# Patient Record
Sex: Female | Born: 1965 | Hispanic: Yes | Marital: Married | Smoking: Current every day smoker
Health system: Southern US, Community
[De-identification: ages and names within clinical notes are randomized; demographics above are authoritative.]

## PROBLEM LIST (undated history)

## (undated) DIAGNOSIS — G510 Bell's palsy: Secondary | ICD-10-CM

## (undated) HISTORY — PX: TUBAL LIGATION: SHX77

---

## 2016-05-09 ENCOUNTER — Emergency Department (HOSPITAL_BASED_OUTPATIENT_CLINIC_OR_DEPARTMENT_OTHER): Payer: BLUE CROSS/BLUE SHIELD

## 2016-05-09 ENCOUNTER — Emergency Department (HOSPITAL_BASED_OUTPATIENT_CLINIC_OR_DEPARTMENT_OTHER)
Admission: EM | Admit: 2016-05-09 | Discharge: 2016-05-09 | Disposition: A | Discharge status: Other Acute Inpatient Hospital | Payer: BLUE CROSS/BLUE SHIELD | Attending: Emergency Medicine | Admitting: Emergency Medicine

## 2016-05-09 ENCOUNTER — Encounter (HOSPITAL_BASED_OUTPATIENT_CLINIC_OR_DEPARTMENT_OTHER): Payer: Self-pay | Admitting: Emergency Medicine

## 2016-05-09 DIAGNOSIS — R0602 Shortness of breath: Secondary | ICD-10-CM | POA: Insufficient documentation

## 2016-05-09 DIAGNOSIS — R112 Nausea with vomiting, unspecified: Secondary | ICD-10-CM | POA: Diagnosis not present

## 2016-05-09 DIAGNOSIS — R05 Cough: Secondary | ICD-10-CM | POA: Insufficient documentation

## 2016-05-09 DIAGNOSIS — R5383 Other fatigue: Secondary | ICD-10-CM | POA: Diagnosis not present

## 2016-05-09 DIAGNOSIS — R739 Hyperglycemia, unspecified: Secondary | ICD-10-CM

## 2016-05-09 DIAGNOSIS — E1010 Type 1 diabetes mellitus with ketoacidosis without coma: Secondary | ICD-10-CM | POA: Insufficient documentation

## 2016-05-09 DIAGNOSIS — F17200 Nicotine dependence, unspecified, uncomplicated: Secondary | ICD-10-CM | POA: Diagnosis not present

## 2016-05-09 DIAGNOSIS — E1110 Type 2 diabetes mellitus with ketoacidosis without coma: Secondary | ICD-10-CM

## 2016-05-09 HISTORY — DX: Bell's palsy: G51.0

## 2016-05-09 LAB — BASIC METABOLIC PANEL
ANION GAP: 18 — AB (ref 5–15)
Anion gap: 13 (ref 5–15)
Anion gap: 10 (ref 5–15)
BUN: 8 mg/dL (ref 6–20)
BUN: 6 mg/dL (ref 6–20)
BUN: 6 mg/dL (ref 6–20)
CO2: 9 mmol/L — AB (ref 22–32)
CO2: 10 mmol/L — ABNORMAL LOW (ref 22–32)
CO2: 12 mmol/L — ABNORMAL LOW (ref 22–32)
Calcium: 9 mg/dL (ref 8.9–10.3)
Calcium: 7.6 mg/dL — ABNORMAL LOW (ref 8.9–10.3)
Calcium: 7.5 mg/dL — ABNORMAL LOW (ref 8.9–10.3)
Chloride: 100 mmol/L — ABNORMAL LOW (ref 101–111)
Chloride: 109 mmol/L (ref 101–111)
Chloride: 110 mmol/L (ref 101–111)
Creatinine, Ser: 0.74 mg/dL (ref 0.44–1.00)
Creatinine, Ser: 0.57 mg/dL (ref 0.44–1.00)
Creatinine, Ser: 0.61 mg/dL (ref 0.44–1.00)
GFR calc Af Amer: >60 mL/min (ref >60)
GFR calc Af Amer: >60 mL/min (ref >60)
GFR calc Af Amer: >60 mL/min (ref >60)
GFR calc non Af Amer: >60 mL/min (ref >60)
GFR calc non Af Amer: >60 mL/min (ref >60)
GLUCOSE: 384 mg/dL — AB (ref 65–99)
Glucose, Bld: 204 mg/dL — ABNORMAL HIGH (ref 65–99)
Glucose, Bld: 183 mg/dL — ABNORMAL HIGH (ref 65–99)
POTASSIUM: 3.9 mmol/L (ref 3.5–5.1)
Potassium: 3.5 mmol/L (ref 3.5–5.1)
Potassium: 4.4 mmol/L (ref 3.5–5.1)
Sodium: 127 mmol/L — ABNORMAL LOW (ref 135–145)
Sodium: 132 mmol/L — ABNORMAL LOW (ref 135–145)
Sodium: 132 mmol/L — ABNORMAL LOW (ref 135–145)

## 2016-05-09 LAB — CBG MONITORING, ED
Glucose-Capillary: 433 mg/dL — ABNORMAL HIGH (ref 65–99)
Glucose-Capillary: 169 mg/dL — ABNORMAL HIGH (ref 65–99)
Glucose-Capillary: 177 mg/dL — ABNORMAL HIGH (ref 65–99)

## 2016-05-09 LAB — I-STAT VENOUS BLOOD GAS, ED
Acid-base deficit: 17 mmol/L — ABNORMAL HIGH (ref 0.0–2.0)
Bicarbonate: 9.2 mEq/L — ABNORMAL LOW (ref 20.0–24.0)
O2 SAT: 82 %
PCO2 VEN: 22.8 mmHg — AB (ref 45.0–50.0)
PH VEN: 7.209 — AB (ref 7.250–7.300)
PO2 VEN: 53 mmHg — AB (ref 31.0–45.0)
Patient temperature: 97.8
TCO2: 10 mmol/L (ref 0–100)

## 2016-05-09 LAB — URINE MICROSCOPIC-ADD ON
RBC / HPF: NONE SEEN RBC/hpf (ref 0–5)
WBC UA: NONE SEEN WBC/hpf (ref 0–5)

## 2016-05-09 LAB — URINALYSIS, ROUTINE W REFLEX MICROSCOPIC
BILIRUBIN URINE: NEGATIVE
Glucose, UA: >1000 mg/dL — AB
Ketones, ur: >80 mg/dL — AB
Leukocytes, UA: NEGATIVE
NITRITE: NEGATIVE
PH: 5 (ref 5.0–8.0)
Protein, ur: 100 mg/dL — AB
SPECIFIC GRAVITY, URINE: 1.036 — AB (ref 1.005–1.030)

## 2016-05-09 LAB — CBC
HEMATOCRIT: 46.2 % — AB (ref 36.0–46.0)
HEMOGLOBIN: 16.6 g/dL — AB (ref 12.0–15.0)
MCH: 30.4 pg (ref 26.0–34.0)
MCHC: 35.9 g/dL (ref 30.0–36.0)
MCV: 84.6 fL (ref 78.0–100.0)
Platelets: 283 10*3/uL (ref 150–400)
RBC: 5.46 MIL/uL — ABNORMAL HIGH (ref 3.87–5.11)
RDW: 13.2 % (ref 11.5–15.5)
WBC: 12.2 10*3/uL — ABNORMAL HIGH (ref 4.0–10.5)

## 2016-05-09 MED ORDER — DEXTROSE 50 % IV SOLN
1.0000 | Freq: Once | INTRAVENOUS | Status: DC | PRN
  Filled 2016-05-09: qty 50

## 2016-05-09 MED ORDER — SODIUM CHLORIDE 0.9 % IV BOLUS (SEPSIS)
1000.0000 mL | Freq: Once | INTRAVENOUS | Status: AC
  Administered 2016-05-09: 1000 mL via INTRAVENOUS

## 2016-05-09 MED ORDER — SODIUM CHLORIDE 0.9 % IV SOLN
INTRAVENOUS | Status: DC
  Administered 2016-05-09: 3.2 [IU]/h via INTRAVENOUS
  Filled 2016-05-09 (×2): qty 2.5

## 2016-05-09 MED ORDER — INSULIN ASPART 100 UNIT/ML ~~LOC~~ SOLN
10.0000 [IU] | Freq: Once | SUBCUTANEOUS | Status: AC
  Administered 2016-05-09: 10 [IU] via INTRAVENOUS
  Filled 2016-05-09: qty 1

## 2016-05-09 MED ORDER — DEXTROSE-NACL 5-0.45 % IV SOLN
INTRAVENOUS | Status: DC
  Administered 2016-05-09: 12:00:00 via INTRAVENOUS

## 2016-05-09 NOTE — ED Notes (Addendum)
Pt states she checked her glucose yesterday at home and found it to be over 500. Pt c/o headache, increased thirst, and general malaise x 1 week. Pt denies hx of diabetes. Pt also concerned about ongoing productive cough x 4 months with brown sputum.

## 2016-05-09 NOTE — ED Notes (Signed)
Report to [facility] staff, pt care transferred. Pt is a/a/o x 4, denies any c/o. Pt and family aware of pending transport and inpatient admit to room 471.

## 2016-05-09 NOTE — ED Notes (Signed)
MD at bedside. 

## 2016-05-09 NOTE — ED Provider Notes (Signed)
CSN: [id number]     Arrival date & time 05/09/16  7829 History   First MD Initiated Contact with Patient 05/09/16 1012     Chief Complaint  Patient presents with  . Hyperglycemia     (Consider location/radiation/quality/duration/timing/severity/associated sxs/prior Treatment) HPI Comments: Patient is a previously healthy 50 year old female who presents with 2 weeks of fatigue, polydipsia. Patient has also had associated nausea, decreased appetite, and blurry vision. Patient wears reading glasses, but she has had blurry vision even with her reading glasses. Patient had one episode of vomiting today. Patient has also had 4 months of a productive cough with light yellow sputum. Patient denies any hemoptysis, nasal congestion. Patient has had episodes of shortness of breath, usually when she is smoking cigarettes, but once today when not smoking cigarettes. Patient smokes 2 packs per week. Patient has no tried any medications at home. Patient denies any chest pain, abdominal pain, dysuria. Patient has not seen in doctor since 2013.  Patient is a 50 y.o. female presenting with hyperglycemia. The history is provided by the patient.  Hyperglycemia Associated symptoms: fatigue, increased thirst, nausea, shortness of breath and vomiting   Associated symptoms: no abdominal pain, no chest pain, no dysuria and no fever     Past Medical History  Diagnosis Date  . Bell's palsy    Past Surgical History  Procedure Laterality Date  . Tubal ligation     No family history on file. Social History  Substance Use Topics  . Smoking status: Current Every Day Smoker  . Smokeless tobacco: None  . Alcohol Use: Yes     Comment: weekly   OB History    No data available     Review of Systems  Constitutional: Positive for appetite change and fatigue. Negative for fever and chills.  HENT: Negative for congestion, facial swelling and sore throat.   Eyes: Positive for visual disturbance (blurry vision).   Respiratory: Positive for cough and shortness of breath.   Cardiovascular: Negative for chest pain.  Gastrointestinal: Positive for nausea and vomiting. Negative for abdominal pain.  Endocrine: Positive for polydipsia.  Genitourinary: Negative for dysuria.  Musculoskeletal: Negative for back pain.  Skin: Negative for rash and wound.  Neurological: Negative for headaches.  Psychiatric/Behavioral: The patient is not nervous/anxious.       Allergies  Review of patient's allergies indicates no known allergies.  Home Medications   Prior to Admission medications   Not on File   BP 116/83 mmHg  Pulse 91  Temp(Src) 97.8 F (36.6 C) (Oral)  Resp 21  Ht  (1.549 m)  Wt 52.164 kg  BMI 21.74 kg/m2  SpO2 100% Physical Exam  Constitutional: She appears well-developed and well-nourished. No distress.  HENT:  Head: Normocephalic and atraumatic.  Mouth/Throat: Oropharynx is clear and moist. No oropharyngeal exudate.  Eyes: Conjunctivae are normal. Pupils are equal, round, and reactive to light. Right eye exhibits no discharge. Left eye exhibits no discharge. No scleral icterus.  Neck: Normal range of motion. Neck supple. No thyromegaly present.  Cardiovascular: Normal rate, regular rhythm, normal heart sounds and intact distal pulses.  Exam reveals no gallop and no friction rub.   No murmur heard. Pulmonary/Chest: Effort normal and breath sounds normal. No stridor. No respiratory distress. She has no wheezes. She has no rales.  Abdominal: Soft. Bowel sounds are normal. She exhibits no distension. There is no tenderness. There is no rebound and no guarding.  Musculoskeletal: She exhibits no edema.  5/5 strength in all  4 extremities  Lymphadenopathy:    She has no cervical adenopathy.  Neurological: She is alert. Coordination normal.  Normal sensation throughout; equal bilateral grip strength  Skin: Skin is warm and dry. No rash noted. She is not diaphoretic. No pallor.   Psychiatric: She has a normal mood and affect.  Nursing note and vitals reviewed.   ED Course  Procedures (including critical care time)  CRITICAL CARE Performed by: Emi Holes   Total critical care time: 30 minutes  Critical care time was exclusive of separately billable procedures and treating other patients.  Critical care was necessary to treat or prevent imminent or life-threatening deterioration.  Critical care was time spent personally by me on the following activities: development of treatment plan with patient and/or surrogate as well as nursing, discussions with consultants, evaluation of patient's response to treatment, examination of patient, obtaining history from patient or surrogate, ordering and performing treatments and interventions, ordering and review of laboratory studies, ordering and review of radiographic studies, pulse oximetry and re-evaluation of patient's condition.   Labs Review Labs Reviewed  BASIC METABOLIC PANEL - Abnormal; Notable for the following:    Sodium 127 (*)    Chloride 100 (*)    CO2 9 (*)    Glucose, Bld 384 (*)    Anion gap 18 (*)    All other components within normal limits  CBC - Abnormal; Notable for the following:    WBC 12.2 (*)    RBC 5.46 (*)    Hemoglobin 16.6 (*)    HCT 46.2 (*)    All other components within normal limits  URINALYSIS, ROUTINE W REFLEX MICROSCOPIC (NOT AT [facility]) - Abnormal; Notable for the following:    Specific Gravity, Urine 1.036 (*)    Glucose, UA >1000 (*)    Hgb urine dipstick SMALL (*)    Ketones, ur >80 (*)    Protein, ur 100 (*)    All other components within normal limits  URINE MICROSCOPIC-ADD ON - Abnormal; Notable for the following:    Squamous Epithelial / LPF 0-5 (*)    Bacteria, UA RARE (*)    All other components within normal limits  BASIC METABOLIC PANEL - Abnormal; Notable for the following:    Sodium 132 (*)    CO2 10 (*)    Glucose, Bld 204 (*)    Calcium 7.6 (*)     All other components within normal limits  CBG MONITORING, ED - Abnormal; Notable for the following:    Glucose-Capillary 433 (*)    All other components within normal limits  CBG MONITORING, ED - Abnormal; Notable for the following:    Glucose-Capillary 169 (*)    All other components within normal limits  I-STAT VENOUS BLOOD GAS, ED - Abnormal; Notable for the following:    pH, Ven 7.209 (*)    pCO2, Ven 22.8 (*)    pO2, Ven 53.0 (*)    Bicarbonate 9.2 (*)    Acid-base deficit 17.0 (*)    All other components within normal limits  BLOOD GAS, VENOUS  BASIC METABOLIC PANEL  BASIC METABOLIC PANEL  BASIC METABOLIC PANEL  BASIC METABOLIC PANEL  BASIC METABOLIC PANEL  BASIC METABOLIC PANEL  BASIC METABOLIC PANEL  CBG MONITORING, ED  CBG MONITORING, ED  CBG MONITORING, ED    Imaging Review Dg Chest 2 View  05/09/2016  CLINICAL DATA:  Ongoing productive cough for the past 4 months. EXAM: CHEST  2 VIEW COMPARISON:  None. FINDINGS:  Partial obscuration of the right heart border secondary to ill-defined anterior mediastinal structure which is favored to represent a prominent epicardial fat pad. Otherwise, normal cardiac silhouette and mediastinal contours. No focal airspace opacities. No pleural effusion or pneumothorax. There is minimal pleural parenchymal thickening about the bilateral major fissures. No evidence of edema. No acute osseous abnormalities. IMPRESSION: No acute cardiopulmonary disease. Specifically, no evidence of pneumonia. Electronically Signed   By: Simonne ComeJohn  Watts M.D.   On: 05/09/2016 10:43   I have personally reviewed and evaluated these images and lab results as part of my medical decision-making.   EKG Interpretation None      MDM   New onset diabetic in DKA. Initial CBG 433. CBC shows WBC 12.2, hemoglobin 16.6. BMP shows sodium 127, potassium 3.9, chloride 100, CO2 9, anion gap 18. VBG shows pH 7.209, PCO2 22.8, PO2 53, bicarbonate 9.2, acid-base deficit 17. CXR  shows no acute cardiopulmonary disease and no evidence of pneumonia. Patient given insulin bolus and drip initiated in ED, as well as 2 L of fluids. Patient would like to be admitted to [facility], as she [place] in [place] than [place]. I spoke with Dr. Ellyn HackPeter Brath at [facility] who will admit the patient to the ICU. Patient also evaluated by Dr. Clydene PughKnott who is in agreement with plan. Patient understands and is in agreement with plan.  Final diagnoses:  Hyperglycemia  Diabetic ketoacidosis without coma associated with type 2 diabetes mellitus [facility](HCC)        Emi Holeslexandra M Quandra Fedorchak, PA-C 05/09/16 1311  Lyndal Pulleyaniel Knott, MD 05/09/16 [phone]

## 2017-02-14 IMAGING — CR DG CHEST 2V
2 series · 2 of 2 positions shown · non-contrast
Comparison: None.

CLINICAL DATA: Ongoing productive cough for the past 4 months.

EXAM:
CHEST  2 VIEW

[w chest pa]
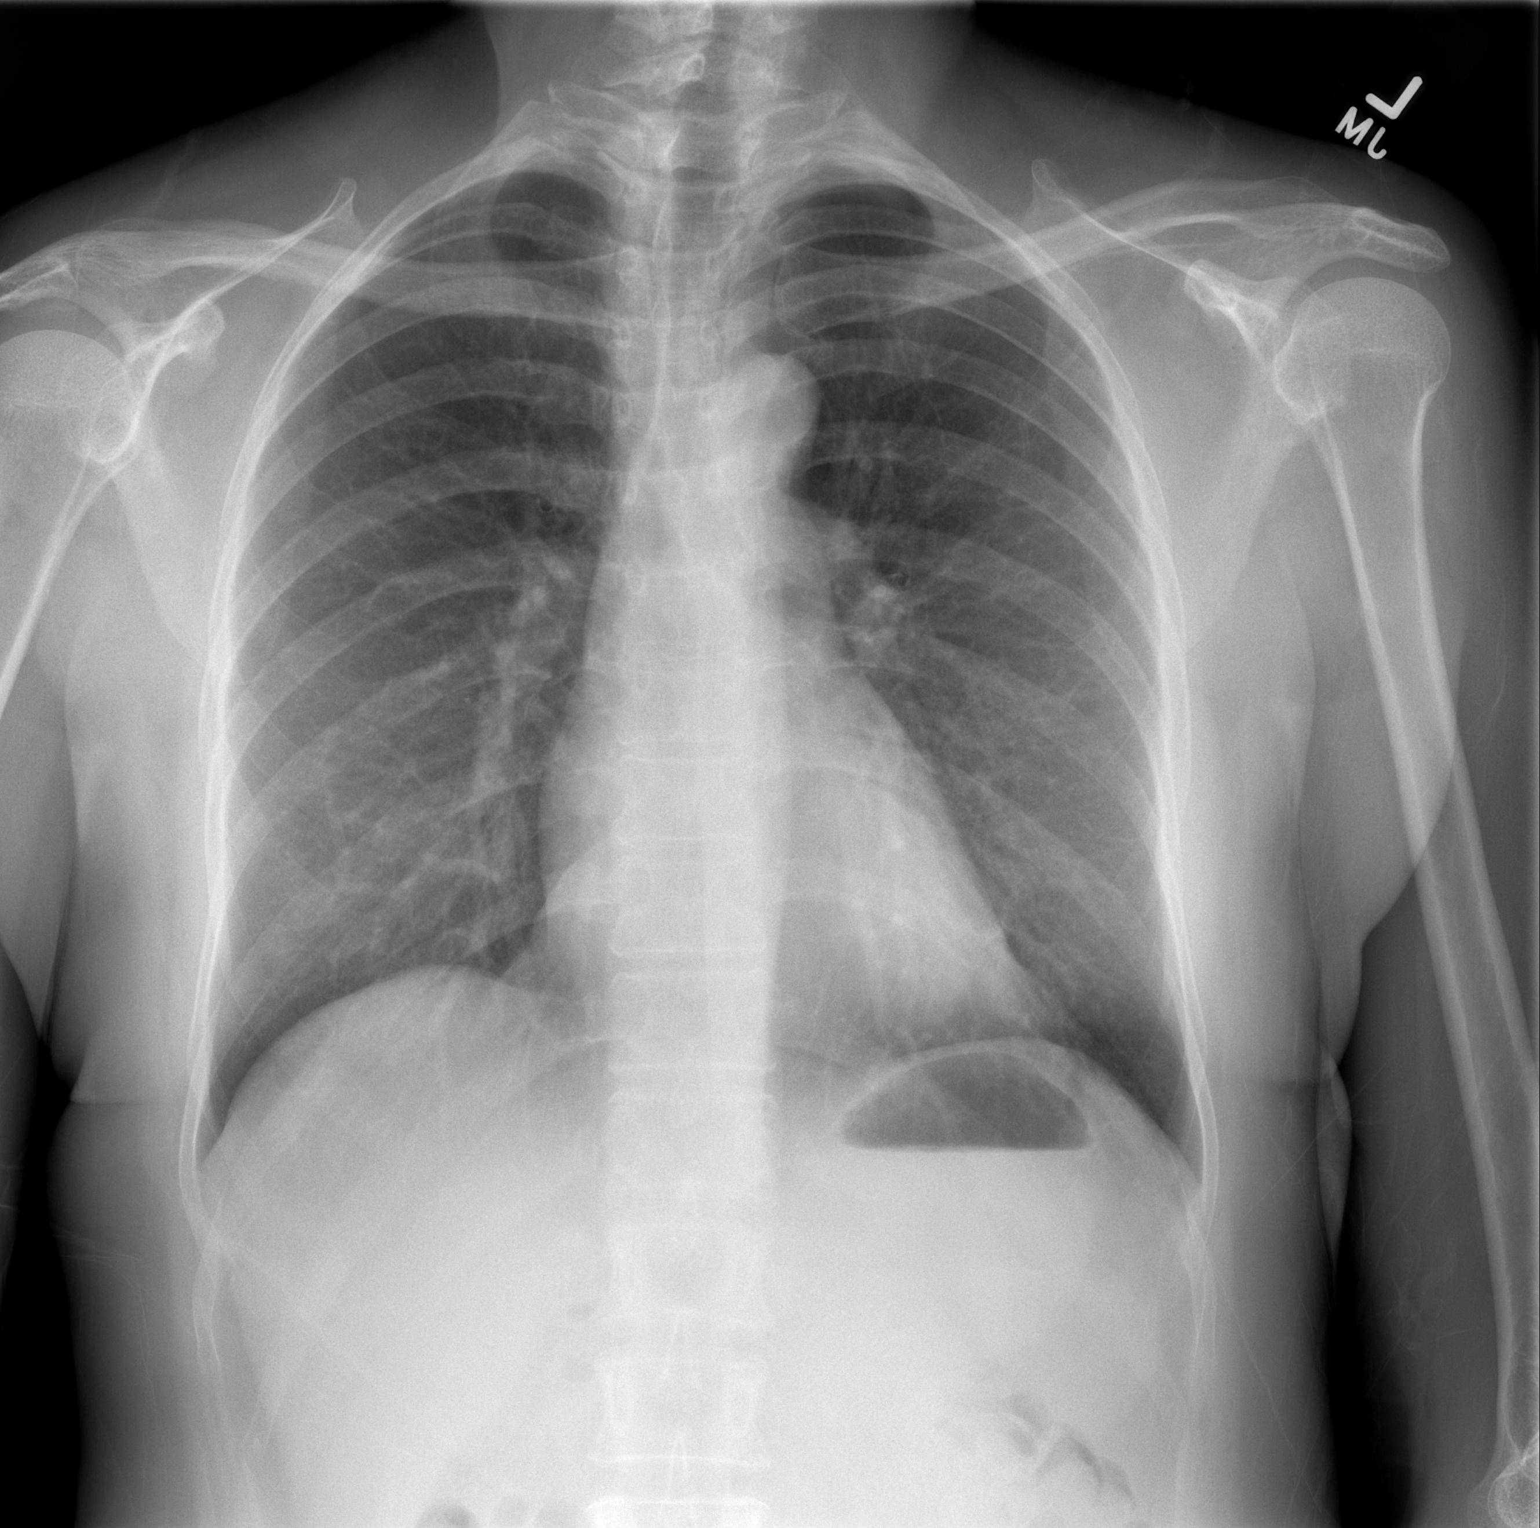

[w chest lat]
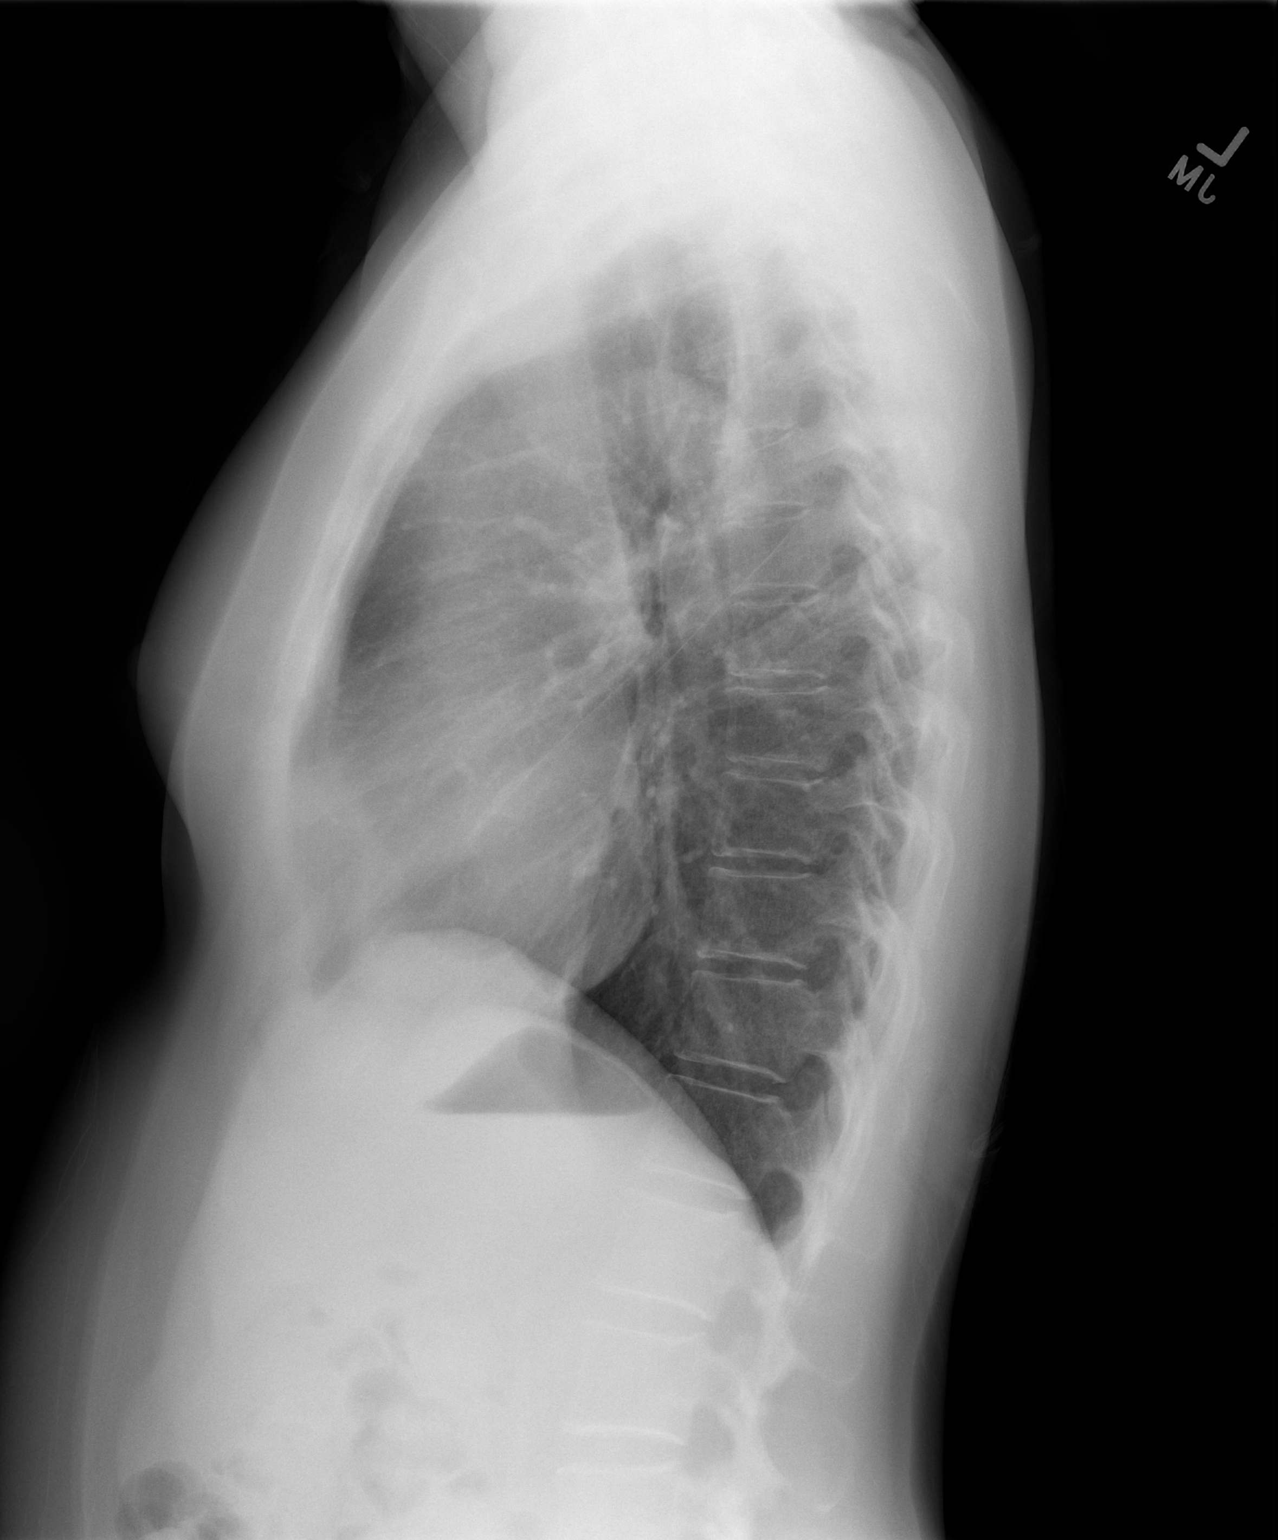

[2 of 2 positions shown; findings below may reference images not displayed]

FINDINGS: Partial obscuration of the right heart border secondary to
ill-defined anterior mediastinal structure which is favored to
represent a prominent epicardial fat pad. Otherwise, normal cardiac
silhouette and mediastinal contours. No focal airspace opacities. No
pleural effusion or pneumothorax. There is minimal pleural
parenchymal thickening about the bilateral major fissures. No
evidence of edema. No acute osseous abnormalities.
IMPRESSION: No acute cardiopulmonary disease. Specifically, no evidence of
pneumonia.

## 2020-12-26 ENCOUNTER — Emergency Department (HOSPITAL_BASED_OUTPATIENT_CLINIC_OR_DEPARTMENT_OTHER)
Admission: EM | Admit: 2020-12-26 | Discharge: 2020-12-26 | Disposition: A | Discharge status: Home / Self Care | Payer: Worker's Compensation | Attending: Emergency Medicine | Admitting: Emergency Medicine

## 2020-12-26 ENCOUNTER — Other Ambulatory Visit: Payer: Self-pay

## 2020-12-26 ENCOUNTER — Encounter (HOSPITAL_BASED_OUTPATIENT_CLINIC_OR_DEPARTMENT_OTHER): Payer: Self-pay

## 2020-12-26 ENCOUNTER — Emergency Department (HOSPITAL_BASED_OUTPATIENT_CLINIC_OR_DEPARTMENT_OTHER): Payer: Worker's Compensation

## 2020-12-26 DIAGNOSIS — F17210 Nicotine dependence, cigarettes, uncomplicated: Secondary | ICD-10-CM | POA: Insufficient documentation

## 2020-12-26 DIAGNOSIS — S61213A Laceration without foreign body of left middle finger without damage to nail, initial encounter: Secondary | ICD-10-CM | POA: Diagnosis not present

## 2020-12-26 DIAGNOSIS — W231XXA Caught, crushed, jammed, or pinched between stationary objects, initial encounter: Secondary | ICD-10-CM | POA: Diagnosis not present

## 2020-12-26 DIAGNOSIS — S6992XA Unspecified injury of left wrist, hand and finger(s), initial encounter: Secondary | ICD-10-CM | POA: Diagnosis present

## 2020-12-26 DIAGNOSIS — Y990 Civilian activity done for income or pay: Secondary | ICD-10-CM | POA: Diagnosis not present

## 2020-12-26 DIAGNOSIS — T1490XA Injury, unspecified, initial encounter: Secondary | ICD-10-CM

## 2020-12-26 MED ORDER — SULFAMETHOXAZOLE-TRIMETHOPRIM 800-160 MG PO TABS
1.0000 | ORAL_TABLET | Freq: Once | ORAL | Status: AC
  Administered 2020-12-26: 1 via ORAL
  Filled 2020-12-26: qty 1

## 2020-12-26 MED ORDER — LIDOCAINE HCL (PF) 1 % IJ SOLN
10.0000 mL | Freq: Once | INTRAMUSCULAR | Status: AC
Start: 2020-12-26 — End: 2020-12-26
  Administered 2020-12-26: 10 mL
  Filled 2020-12-26: qty 10

## 2020-12-26 NOTE — Discharge Instructions (Signed)
The sutures will need to be removed in 7 days.  Keep the area clean and dry.  Do not get the area wet for the first 24 hours and you can shower with this does not soak in a bathtub.  Do not apply antibiotic ointment as this can cause the sutures to dissolve quicker.  Wear the splint to help keep the sutures in place while you are bending your finger.  Motrin Tylenol for pain.  Ice and elevate the area.  If you find any signs of infection follow-up with your primary care doctor or return to the ER.

## 2020-12-26 NOTE — ED Triage Notes (Signed)
Pt states got finger stuck in a machine at work. States went to [facility] but they sent her here because they dont do workers comp. Laceration to ring finger on left hand. States last tetanus within 5 years

## 2020-12-26 NOTE — ED Provider Notes (Signed)
[facility] EMERGENCY DEPARTMENT Provider Note   CSN: [id number] Arrival date & time: 12/26/20  1015     History No chief complaint on file.   Beth Henson is a 55 y.o. female.  HPI 55 year old female presents the ER for evaluation of left finger laceration.  Patient reports that she was at work this morning when her finger got caught in the machine.  Patient sustained a laceration over the dorsal aspect of the left middle finger.  She reports that she was trying to go back to work however time she bends the finger bleeding continues.  Patient denies any numbness or tingling.  She has taken no medications for pain prior to arrival.  Patient reports her last tetanus shot was 2 years ago.    Past Medical History:  Diagnosis Date  . Bell's palsy     There are no problems to display for this patient.   Past Surgical History:  Procedure Laterality Date  . TUBAL LIGATION       OB History   No obstetric history on file.     History reviewed. No pertinent family history.  Social History   Tobacco Use  . Smoking status: Current Every Day Smoker    Packs/day: 0.25    Types: Cigarettes  Substance Use Topics  . Alcohol use: Yes    Comment: weekly  . Drug use: No    Home Medications Prior to Admission medications   Not on File    Allergies    Patient has no known allergies.  Review of Systems   Review of Systems  Constitutional: Negative for chills and unexpected weight change.  HENT: Negative for congestion.   Eyes: Negative for discharge.  Respiratory: Negative for cough.   Musculoskeletal: Positive for myalgias.  Skin: Positive for wound.  Neurological: Negative for weakness and numbness.  Psychiatric/Behavioral: Negative for confusion.    Physical Exam Updated Vital Signs BP (!) 142/98 (BP Location: Right Arm)   Pulse 80   Temp 98 F (36.7 C) (Oral)   Resp 18   Ht 5\' 1"  (1.549 m)   Wt 49.9 kg   SpO2 100%   BMI 20.78 kg/m   Physical  Exam Vitals and nursing note reviewed.  Constitutional:      General: She is not in acute distress.    Appearance: She is well-developed and well-nourished. She is not ill-appearing or toxic-appearing.  HENT:     Head: Normocephalic and atraumatic.  Eyes:     General: No scleral icterus.       Right eye: No discharge.        Left eye: No discharge.  Cardiovascular:     Pulses: Normal pulses.  Pulmonary:     Effort: No respiratory distress.  Musculoskeletal:        General: Normal range of motion.     Cervical back: Normal range of motion.  Skin:    General: Skin is warm and dry.     Capillary Refill: Capillary refill takes less than 2 seconds.     Coloration: Skin is not pale.  Neurological:     Mental Status: She is alert.     Sensory: No sensory deficit.     Motor: No weakness.  Psychiatric:        Behavior: Behavior normal.        Thought Content: Thought content normal.        Judgment: Judgment normal.     ED Results / Procedures / Treatments  Labs (all labs ordered are listed, but only abnormal results are displayed) Labs Reviewed - No data to display  EKG None  Radiology No results found.  Procedures .Marland KitchenLaceration Repair  Date/Time: 12/26/2020 1:22 PM Performed by: Rise Mu, PA-C Authorized by: Rise Mu, PA-C   Consent:    Consent obtained:  Verbal   Consent given by:  Patient   Risks, benefits, and alternatives were discussed: yes     Risks discussed:  Infection, pain, retained foreign body, tendon damage, vascular damage, poor cosmetic result, poor wound healing, nerve damage and need for additional repair   Alternatives discussed:  No treatment Anesthesia:    Anesthesia method:  Nerve block   Block location:  Finger   Block needle gauge:  25 G   Block anesthetic:  Lidocaine 1% w/o epi   Block injection procedure:  Anatomic landmarks identified, introduced needle, incremental injection, negative aspiration for blood and  anatomic landmarks palpated   Block outcome:  Anesthesia achieved Laceration details:    Location:  Finger   Finger location:  L long finger   Length (cm):  1 Pre-procedure details:    Preparation:  Imaging obtained to evaluate for foreign bodies and patient was prepped and draped in usual sterile fashion Exploration:    Hemostasis achieved with:  Direct pressure   Wound exploration: wound explored through full range of motion and entire depth of wound visualized   Treatment:    Area cleansed with:  Povidone-iodine and saline   Amount of cleaning:  Standard   Irrigation solution:  Sterile saline   Irrigation method:  Pressure wash Skin repair:    Repair method:  Sutures   Suture size:  5-0   Suture material:  Prolene   Suture technique:  Simple interrupted   Number of sutures:  4 Approximation:    Approximation:  Close Repair type:    Repair type:  Simple Post-procedure details:    Dressing:  Antibiotic ointment, non-adherent dressing and splint for protection   Procedure completion:  Tolerated     Medications Ordered in ED Medications  lidocaine (PF) (XYLOCAINE) 1 % injection 10 mL (has no administration in time range)    ED Course  I have reviewed the triage vital signs and the nursing notes.  Pertinent labs & imaging results that were available during my care of the patient were reviewed by me and considered in my medical decision making (see chart for details).    MDM Rules/Calculators/A&P                          55 year old presents the ER for finger laceration.  Patient neurovascularly intact.  Tetanus shot updated 2 years ago.  X-ray obtained and reviewed by myself.  Shows no evidence of retained foreign body or bony involvement.  Laceration was repaired as above as there were no complications.  Patient placed in splint and provided wound and suture care.  Pt is hemodynamically stable, in NAD, & able to ambulate in the ED. Evaluation does not show pathology  that would require ongoing emergent intervention or inpatient treatment. I explained the diagnosis to the patient. Pain has been managed & has no complaints prior to dc. Pt is comfortable with above plan and is stable for discharge at this time. All questions were answered prior to disposition. Strict return precautions for f/u to the ED were discussed. Encouraged follow up with PCP.  Final Clinical Impression(s) / ED Diagnoses Final  diagnoses:  Laceration of left middle finger without foreign body without damage to nail, initial encounter    Rx / DC Orders ED Discharge Orders    None       Wallace Keller 12/26/20 1324    Pricilla Loveless, MD 12/28/20 [phone]

## 2021-10-03 IMAGING — CR DG FINGER RING 2+V*L*
3 series · 3 of 3 positions shown · non-contrast
Comparison: None.

CLINICAL DATA: Left ring finger laceration with pain, initial
encounter

EXAM:
LEFT RING FINGER 2+V

[x finger pa left]
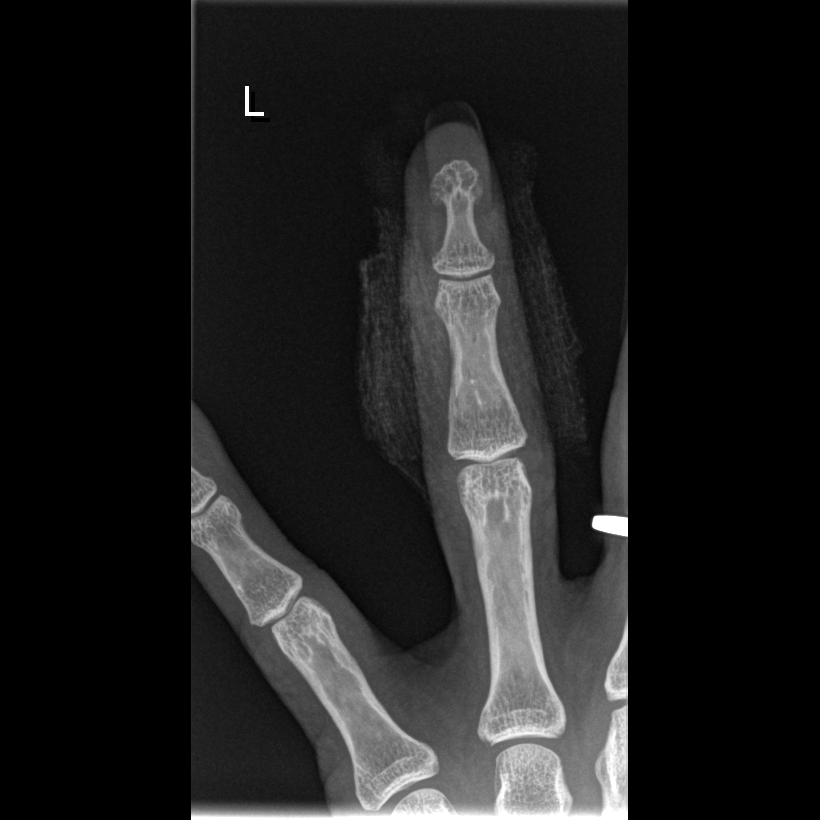

[x finger obl. left]
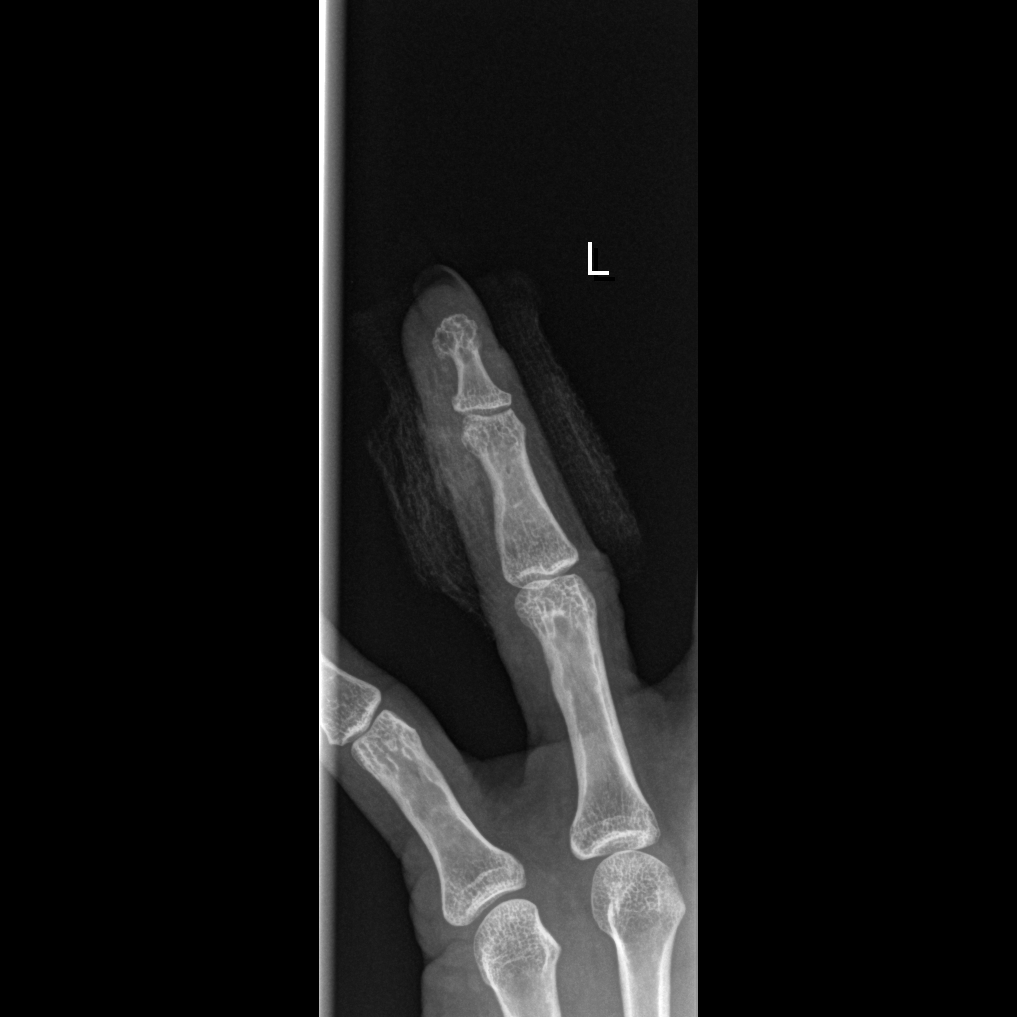

[x finger lateral left]
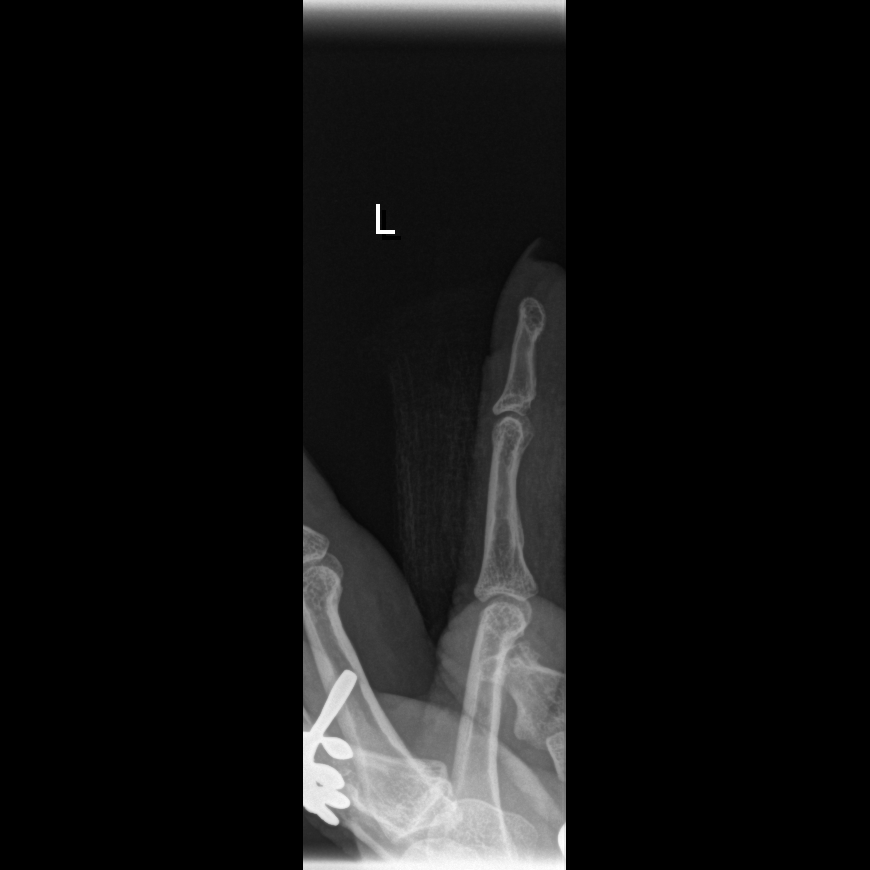

[3 of 3 positions shown; findings below may reference images not displayed]

FINDINGS: Soft tissue abnormality is noted consistent with the recent injury.
No acute fracture or dislocation is seen. No foreign body is noted.
IMPRESSION: Soft tissue injury without acute bony abnormality.
